# Patient Record
Sex: Male | Born: 1980 | Race: White | Hispanic: No | Marital: Married | Smoking: Current every day smoker
Health system: Southern US, Community
[De-identification: ages and names within clinical notes are randomized; demographics above are authoritative.]

## PROBLEM LIST (undated history)

## (undated) DIAGNOSIS — T148XXA Other injury of unspecified body region, initial encounter: Secondary | ICD-10-CM

## (undated) HISTORY — PX: ABDOMINAL SURGERY: SHX537

---

## 2020-02-01 ENCOUNTER — Emergency Department (HOSPITAL_COMMUNITY)
Admission: EM | Admit: 2020-02-01 | Discharge: 2020-02-02 | Disposition: A | Discharge status: Home / Self Care | Payer: Self-pay | Attending: Emergency Medicine | Admitting: Emergency Medicine

## 2020-02-01 DIAGNOSIS — Z20822 Contact with and (suspected) exposure to covid-19: Secondary | ICD-10-CM | POA: Insufficient documentation

## 2020-02-01 DIAGNOSIS — Y92410 Unspecified street and highway as the place of occurrence of the external cause: Secondary | ICD-10-CM | POA: Insufficient documentation

## 2020-02-01 DIAGNOSIS — S298XXA Other specified injuries of thorax, initial encounter: Secondary | ICD-10-CM

## 2020-02-01 DIAGNOSIS — S161XXA Strain of muscle, fascia and tendon at neck level, initial encounter: Secondary | ICD-10-CM | POA: Insufficient documentation

## 2020-02-01 DIAGNOSIS — J019 Acute sinusitis, unspecified: Secondary | ICD-10-CM | POA: Insufficient documentation

## 2020-02-01 DIAGNOSIS — S060X9A Concussion with loss of consciousness of unspecified duration, initial encounter: Secondary | ICD-10-CM | POA: Insufficient documentation

## 2020-02-01 DIAGNOSIS — T1490XA Injury, unspecified, initial encounter: Secondary | ICD-10-CM

## 2020-02-01 DIAGNOSIS — S299XXA Unspecified injury of thorax, initial encounter: Secondary | ICD-10-CM | POA: Insufficient documentation

## 2020-02-01 DIAGNOSIS — S3991XA Unspecified injury of abdomen, initial encounter: Secondary | ICD-10-CM | POA: Insufficient documentation

## 2020-02-01 HISTORY — DX: Other injury of unspecified body region, initial encounter: T14.8XXA

## 2020-02-02 ENCOUNTER — Emergency Department (HOSPITAL_COMMUNITY): Payer: Self-pay

## 2020-02-02 ENCOUNTER — Other Ambulatory Visit: Payer: Self-pay

## 2020-02-02 ENCOUNTER — Encounter (HOSPITAL_COMMUNITY): Payer: Self-pay | Admitting: *Deleted

## 2020-02-02 LAB — COMPREHENSIVE METABOLIC PANEL
ALT: 42 U/L (ref 0–44)
AST: 47 U/L — ABNORMAL HIGH (ref 15–41)
Albumin: 3.9 g/dL (ref 3.5–5.0)
Alkaline Phosphatase: 105 U/L (ref 38–126)
Anion gap: 12 (ref 5–15)
BUN: 11 mg/dL (ref 6–20)
CO2: 24 mmol/L (ref 22–32)
Calcium: 9 mg/dL (ref 8.9–10.3)
Chloride: 102 mmol/L (ref 98–111)
Creatinine, Ser: 1.55 mg/dL — ABNORMAL HIGH (ref 0.61–1.24)
GFR, Estimated: 30 mL/min — ABNORMAL LOW (ref >60)
Glucose, Bld: 76 mg/dL (ref 70–99)
Potassium: 3.3 mmol/L — ABNORMAL LOW (ref 3.5–5.1)
Sodium: 138 mmol/L (ref 135–145)
Total Bilirubin: 0.5 mg/dL (ref 0.3–1.2)
Total Protein: 6.8 g/dL (ref 6.5–8.1)

## 2020-02-02 LAB — ETHANOL: Alcohol, Ethyl (B): <10 mg/dL (ref <10)

## 2020-02-02 LAB — URINALYSIS, ROUTINE W REFLEX MICROSCOPIC
Bilirubin Urine: NEGATIVE
Glucose, UA: NEGATIVE mg/dL
Hgb urine dipstick: NEGATIVE
Ketones, ur: NEGATIVE mg/dL
Leukocytes,Ua: NEGATIVE
Nitrite: NEGATIVE
Protein, ur: NEGATIVE mg/dL
Specific Gravity, Urine: 1.036 — ABNORMAL HIGH (ref 1.005–1.030)
pH: 5 (ref 5.0–8.0)

## 2020-02-02 LAB — CBC
HCT: 41.3 % (ref 39.0–52.0)
Hemoglobin: 13.4 g/dL (ref 13.0–17.0)
MCH: 30 pg (ref 26.0–34.0)
MCHC: 32.4 g/dL (ref 30.0–36.0)
MCV: 92.6 fL (ref 80.0–100.0)
Platelets: 489 10*3/uL — ABNORMAL HIGH (ref 150–400)
RBC: 4.46 MIL/uL (ref 4.22–5.81)
RDW: 14.5 % (ref 11.5–15.5)
WBC: 15.3 10*3/uL — ABNORMAL HIGH (ref 4.0–10.5)
nRBC: 0 % (ref 0.0–0.2)

## 2020-02-02 LAB — I-STAT CHEM 8, ED
BUN: 12 mg/dL (ref 6–20)
Calcium, Ion: 1.11 mmol/L — ABNORMAL LOW (ref 1.15–1.40)
Chloride: 104 mmol/L (ref 98–111)
Creatinine, Ser: 1.4 mg/dL — ABNORMAL HIGH (ref 0.61–1.24)
Glucose, Bld: 71 mg/dL (ref 70–99)
HCT: 41 % (ref 39.0–52.0)
Hemoglobin: 13.9 g/dL (ref 13.0–17.0)
Potassium: 3.3 mmol/L — ABNORMAL LOW (ref 3.5–5.1)
Sodium: 142 mmol/L (ref 135–145)
TCO2: 26 mmol/L (ref 22–32)

## 2020-02-02 LAB — RESP PANEL BY RT-PCR (FLU A&B, COVID) ARPGX2
Influenza A by PCR: NEGATIVE
Influenza B by PCR: NEGATIVE
SARS Coronavirus 2 by RT PCR: NEGATIVE

## 2020-02-02 LAB — RAPID URINE DRUG SCREEN, HOSP PERFORMED
Amphetamines: POSITIVE — AB
Barbiturates: NOT DETECTED
Benzodiazepines: POSITIVE — AB
Cocaine: POSITIVE — AB
Opiates: NOT DETECTED
Tetrahydrocannabinol: POSITIVE — AB

## 2020-02-02 LAB — SAMPLE TO BLOOD BANK

## 2020-02-02 LAB — PROTIME-INR
INR: 1.1 (ref 0.8–1.2)
Prothrombin Time: 13.5 seconds (ref 11.4–15.2)

## 2020-02-02 LAB — LACTIC ACID, PLASMA: Lactic Acid, Venous: 2.7 mmol/L (ref 0.5–1.9)

## 2020-02-02 MED ORDER — LACTATED RINGERS IV BOLUS
1000.0000 mL | Freq: Once | INTRAVENOUS | Status: AC
  Administered 2020-02-02: 1000 mL via INTRAVENOUS

## 2020-02-02 MED ORDER — AMOXICILLIN 500 MG PO CAPS: 500.0000 mg | ORAL_CAPSULE | Freq: Three times a day (TID) | ORAL | 0 refills | Status: AC

## 2020-02-02 MED ORDER — FENTANYL CITRATE (PF) 100 MCG/2ML IJ SOLN: 50.0000 ug | Freq: Once | INTRAMUSCULAR | Status: AC

## 2020-02-02 MED ORDER — FENTANYL CITRATE (PF) 100 MCG/2ML IJ SOLN
INTRAMUSCULAR | Status: AC
  Administered 2020-02-02: 50 ug via INTRAVENOUS
  Filled 2020-02-02: qty 2

## 2020-02-02 MED ORDER — IOHEXOL 300 MG/ML  SOLN
100.0000 mL | Freq: Once | INTRAMUSCULAR | Status: AC | PRN
  Administered 2020-02-02: 01:00:00 100 mL via INTRAVENOUS
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
## 2020-02-02 NOTE — ED Notes (Signed)
Patient transported to CT 

## 2020-02-02 NOTE — ED Notes (Signed)
Pt ambulated in room, tolerating fluids without difficulty. VSS.
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
## 2020-02-02 NOTE — ED Notes (Signed)
Pt verbalized understanding of verbal and written d/c instructions given at time of d/c. Pt taken in w/c to POV for discharge. Stable.
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
## 2022-01-21 ENCOUNTER — Emergency Department (HOSPITAL_COMMUNITY): Payer: Medicaid - Out of State

## 2022-01-21 ENCOUNTER — Other Ambulatory Visit: Payer: Self-pay

## 2022-01-21 ENCOUNTER — Emergency Department (HOSPITAL_COMMUNITY)
Admission: EM | Admit: 2022-01-21 | Discharge: 2022-01-22 | Disposition: A | Discharge status: Home / Self Care | Payer: Medicaid - Out of State | Attending: Emergency Medicine | Admitting: Emergency Medicine

## 2022-01-21 DIAGNOSIS — Y92410 Unspecified street and highway as the place of occurrence of the external cause: Secondary | ICD-10-CM | POA: Insufficient documentation

## 2022-01-21 DIAGNOSIS — F17200 Nicotine dependence, unspecified, uncomplicated: Secondary | ICD-10-CM | POA: Diagnosis not present

## 2022-01-21 DIAGNOSIS — M545 Low back pain, unspecified: Secondary | ICD-10-CM

## 2022-01-21 DIAGNOSIS — R202 Paresthesia of skin: Secondary | ICD-10-CM | POA: Diagnosis not present

## 2022-01-21 MED ORDER — SODIUM CHLORIDE 0.9 % IV BOLUS
1000.0000 mL | Freq: Once | INTRAVENOUS | Status: AC
  Administered 2022-01-21: 1000 mL via INTRAVENOUS

## 2022-01-21 MED ORDER — HYDROMORPHONE HCL 1 MG/ML IJ SOLN
1.0000 mg | Freq: Once | INTRAMUSCULAR | Status: AC
  Administered 2022-01-21: 1 mg via INTRAVENOUS
  Filled 2022-01-21: qty 1
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
## 2022-01-22 ENCOUNTER — Emergency Department (HOSPITAL_COMMUNITY): Payer: Medicaid - Out of State

## 2022-01-22 LAB — SAMPLE TO BLOOD BANK

## 2022-01-22 LAB — I-STAT CHEM 8, ED
BUN: 13 mg/dL (ref 6–20)
Calcium, Ion: 1.19 mmol/L (ref 1.15–1.40)
Chloride: 103 mmol/L (ref 98–111)
Creatinine, Ser: 1.2 mg/dL (ref 0.61–1.24)
Glucose, Bld: 96 mg/dL (ref 70–99)
HCT: 40 % (ref 39.0–52.0)
Hemoglobin: 13.6 g/dL (ref 13.0–17.0)
Potassium: 3.5 mmol/L (ref 3.5–5.1)
Sodium: 140 mmol/L (ref 135–145)
TCO2: 26 mmol/L (ref 22–32)

## 2022-01-22 MED ORDER — IOHEXOL 350 MG/ML SOLN
75.0000 mL | Freq: Once | INTRAVENOUS | Status: AC | PRN
  Administered 2022-01-22: 75 mL via INTRAVENOUS

## 2022-01-22 NOTE — ED Notes (Signed)
Pt left without discharge instructions MD Bero aware
